# Patient Record
Sex: Female | Born: 1965 | Race: Black or African American | Hispanic: No | Marital: Married | Smoking: Never smoker
Health system: Southern US, Community
[De-identification: ages and names within clinical notes are randomized; demographics above are authoritative.]

## PROBLEM LIST (undated history)

## (undated) DIAGNOSIS — I10 Essential (primary) hypertension: Secondary | ICD-10-CM

---

## 1998-08-25 ENCOUNTER — Other Ambulatory Visit: Admission: RE | Admit: 1998-08-25 | Discharge: 1998-08-25 | Payer: Self-pay | Admitting: Obstetrics and Gynecology

## 1998-09-06 ENCOUNTER — Ambulatory Visit (HOSPITAL_COMMUNITY): Admission: RE | Admit: 1998-09-06 | Discharge: 1998-09-06 | Payer: Self-pay | Admitting: *Deleted

## 1998-10-25 ENCOUNTER — Emergency Department (HOSPITAL_COMMUNITY): Admission: EM | Admit: 1998-10-25 | Discharge: 1998-10-25 | Payer: Self-pay

## 2001-04-25 ENCOUNTER — Encounter: Admission: RE | Admit: 2001-04-25 | Discharge: 2001-07-24 | Payer: Self-pay | Admitting: Family Medicine

## 2001-09-16 ENCOUNTER — Other Ambulatory Visit: Admission: RE | Admit: 2001-09-16 | Discharge: 2001-09-16 | Payer: Self-pay | Admitting: Obstetrics and Gynecology

## 2001-11-11 ENCOUNTER — Encounter: Payer: Self-pay | Admitting: Obstetrics and Gynecology

## 2001-11-11 ENCOUNTER — Ambulatory Visit (HOSPITAL_COMMUNITY): Admission: RE | Admit: 2001-11-11 | Discharge: 2001-11-11 | Payer: Self-pay | Admitting: Obstetrics and Gynecology

## 2002-01-13 ENCOUNTER — Ambulatory Visit (HOSPITAL_COMMUNITY): Admission: RE | Admit: 2002-01-13 | Discharge: 2002-01-13 | Payer: Self-pay | Admitting: Obstetrics and Gynecology

## 2002-01-13 ENCOUNTER — Encounter: Payer: Self-pay | Admitting: Obstetrics and Gynecology

## 2002-01-31 ENCOUNTER — Ambulatory Visit (HOSPITAL_COMMUNITY): Admission: RE | Admit: 2002-01-31 | Discharge: 2002-01-31 | Payer: Self-pay | Admitting: Family Medicine

## 2002-02-28 ENCOUNTER — Encounter (HOSPITAL_COMMUNITY): Admission: AD | Admit: 2002-02-28 | Discharge: 2002-03-10 | Payer: Self-pay | Admitting: Obstetrics and Gynecology

## 2002-03-10 ENCOUNTER — Encounter: Payer: Self-pay | Admitting: Obstetrics and Gynecology

## 2002-03-31 ENCOUNTER — Encounter: Payer: Self-pay | Admitting: Obstetrics and Gynecology

## 2002-03-31 ENCOUNTER — Ambulatory Visit (HOSPITAL_COMMUNITY): Admission: RE | Admit: 2002-03-31 | Discharge: 2002-03-31 | Payer: Self-pay | Admitting: Obstetrics and Gynecology

## 2002-04-07 ENCOUNTER — Inpatient Hospital Stay (HOSPITAL_COMMUNITY): Admission: AD | Admit: 2002-04-07 | Discharge: 2002-04-07 | Payer: Self-pay | Admitting: Obstetrics and Gynecology

## 2002-04-08 ENCOUNTER — Inpatient Hospital Stay (HOSPITAL_COMMUNITY): Admission: AD | Admit: 2002-04-08 | Discharge: 2002-04-11 | Payer: Self-pay | Admitting: Obstetrics and Gynecology

## 2002-08-06 ENCOUNTER — Other Ambulatory Visit: Admission: RE | Admit: 2002-08-06 | Discharge: 2002-08-06 | Payer: Self-pay | Admitting: Obstetrics and Gynecology

## 2003-09-21 ENCOUNTER — Other Ambulatory Visit: Admission: RE | Admit: 2003-09-21 | Discharge: 2003-09-21 | Payer: Self-pay | Admitting: Obstetrics and Gynecology

## 2004-08-02 ENCOUNTER — Ambulatory Visit (HOSPITAL_COMMUNITY): Admission: RE | Admit: 2004-08-02 | Discharge: 2004-08-02 | Payer: Self-pay | Admitting: Obstetrics and Gynecology

## 2004-11-08 ENCOUNTER — Inpatient Hospital Stay (HOSPITAL_COMMUNITY): Admission: AD | Admit: 2004-11-08 | Discharge: 2004-11-08 | Payer: Self-pay | Admitting: Obstetrics and Gynecology

## 2004-11-11 ENCOUNTER — Ambulatory Visit (HOSPITAL_COMMUNITY): Admission: RE | Admit: 2004-11-11 | Discharge: 2004-11-11 | Payer: Self-pay | Admitting: Obstetrics and Gynecology

## 2004-11-14 ENCOUNTER — Inpatient Hospital Stay (HOSPITAL_COMMUNITY): Admission: AD | Admit: 2004-11-14 | Discharge: 2004-11-14 | Payer: Self-pay | Admitting: Obstetrics and Gynecology

## 2004-12-06 ENCOUNTER — Ambulatory Visit (HOSPITAL_COMMUNITY): Admission: RE | Admit: 2004-12-06 | Discharge: 2004-12-06 | Payer: Self-pay | Admitting: Obstetrics and Gynecology

## 2005-01-02 ENCOUNTER — Inpatient Hospital Stay (HOSPITAL_COMMUNITY): Admission: AD | Admit: 2005-01-02 | Discharge: 2005-01-05 | Payer: Self-pay | Admitting: Obstetrics and Gynecology

## 2005-01-10 ENCOUNTER — Inpatient Hospital Stay (HOSPITAL_COMMUNITY): Admission: AD | Admit: 2005-01-10 | Discharge: 2005-01-10 | Payer: Self-pay | Admitting: Obstetrics and Gynecology

## 2006-02-26 ENCOUNTER — Emergency Department (HOSPITAL_COMMUNITY): Admission: EM | Admit: 2006-02-26 | Discharge: 2006-02-27 | Payer: Self-pay | Admitting: Emergency Medicine

## 2006-03-09 ENCOUNTER — Ambulatory Visit (HOSPITAL_COMMUNITY): Admission: RE | Admit: 2006-03-09 | Discharge: 2006-03-10 | Payer: Self-pay | Admitting: Orthopaedic Surgery

## 2006-09-20 IMAGING — US US FETAL BPP W/O NONSTRESS
1 series · 14 of 23 positions shown · non-contrast
Comparison: 08/02/04.

CLINICAL DATA: Third trimester pregnancy.  Hypertension.  Proteinuria.   Nonreative NST.  

 BIOPHYSICAL PROFILE

[Series 1: us fetal bpp w/o nonstress · 0.37mm/px · 14 of 23 slices shown]
[im 1/23]
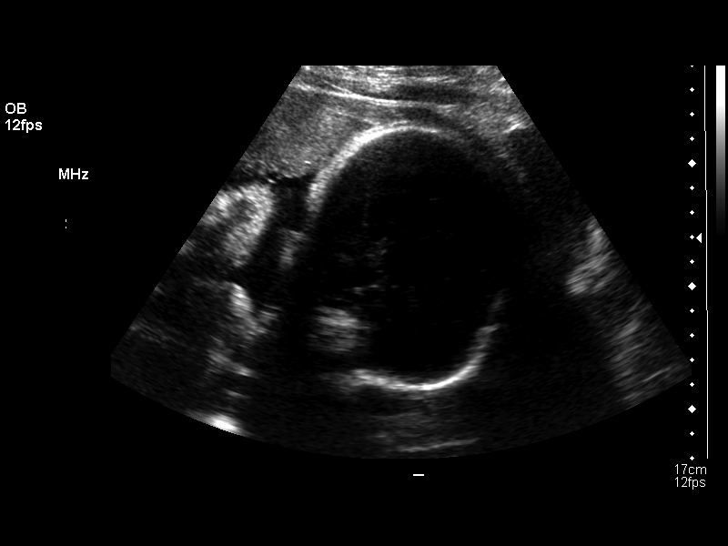
[im 3/23]
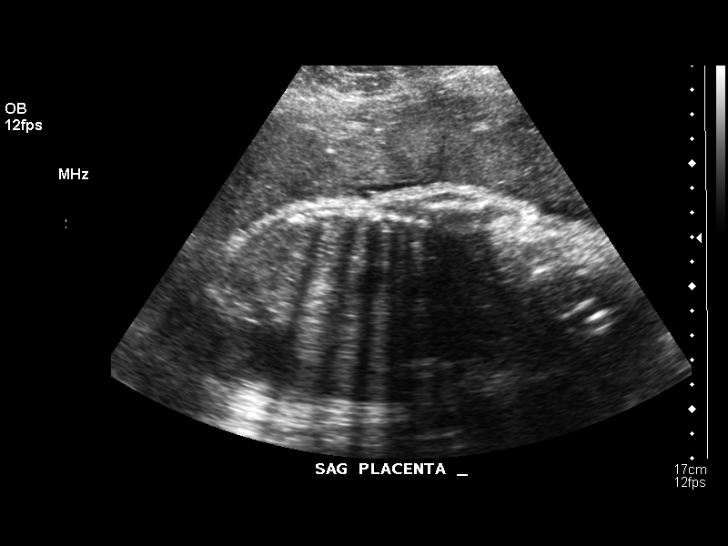
[im 5/23]
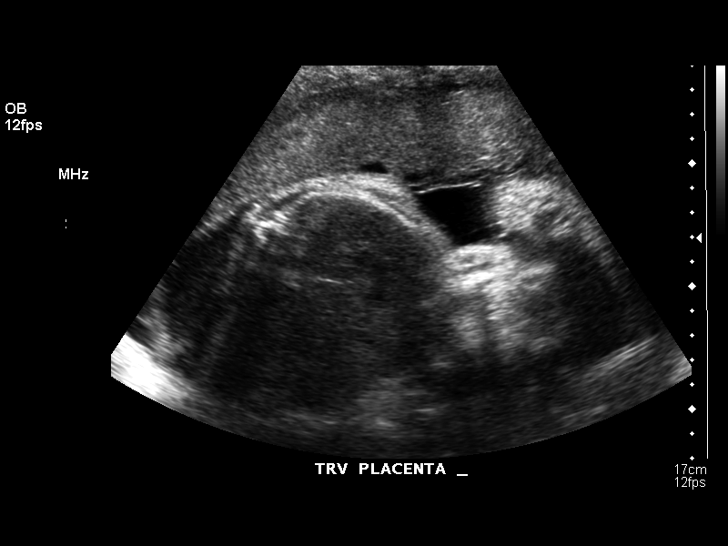
[im 6/23]
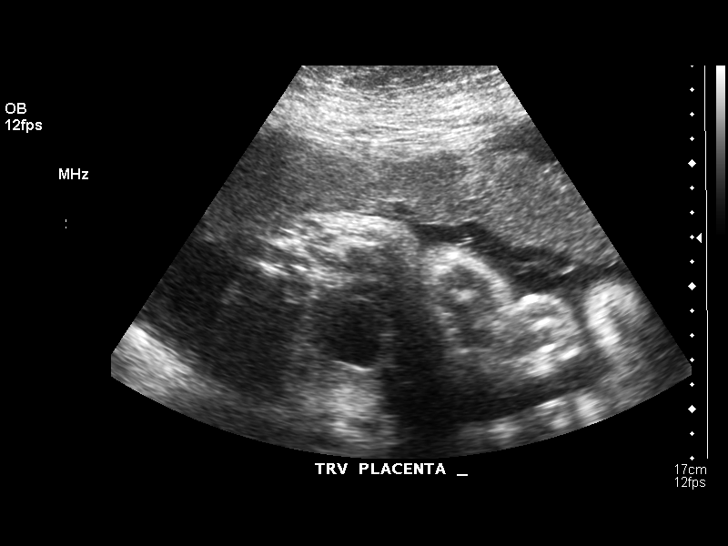
[im 8/23]
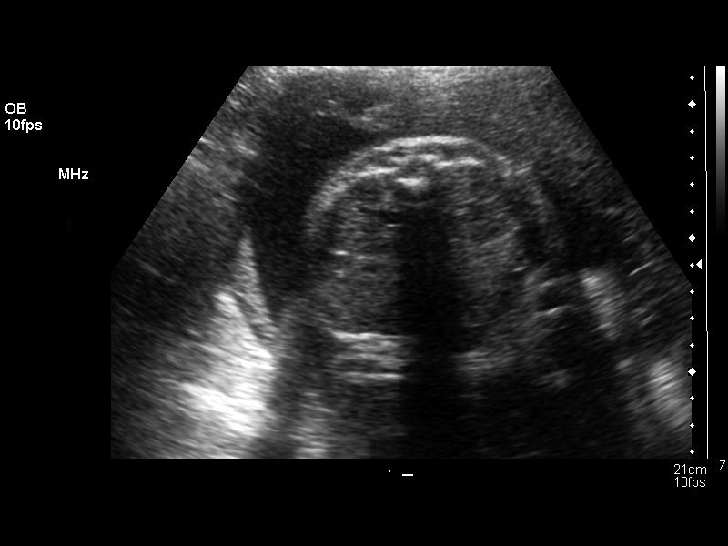
[im 10/23]
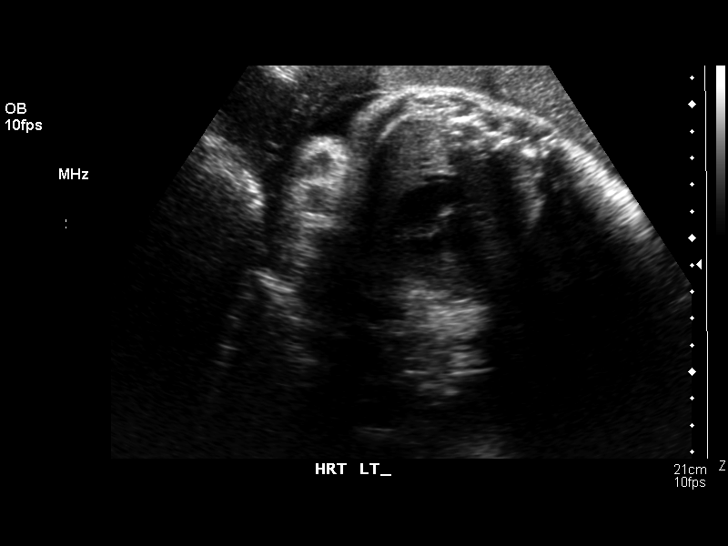
[im 11/23]
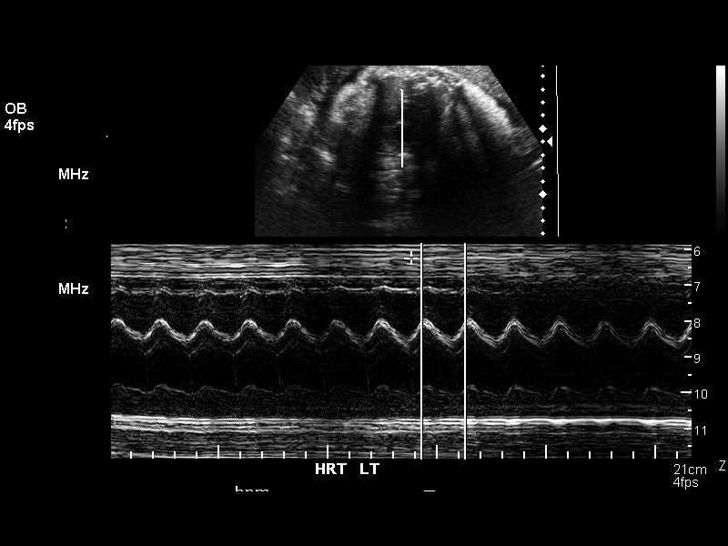
[im 13/23]
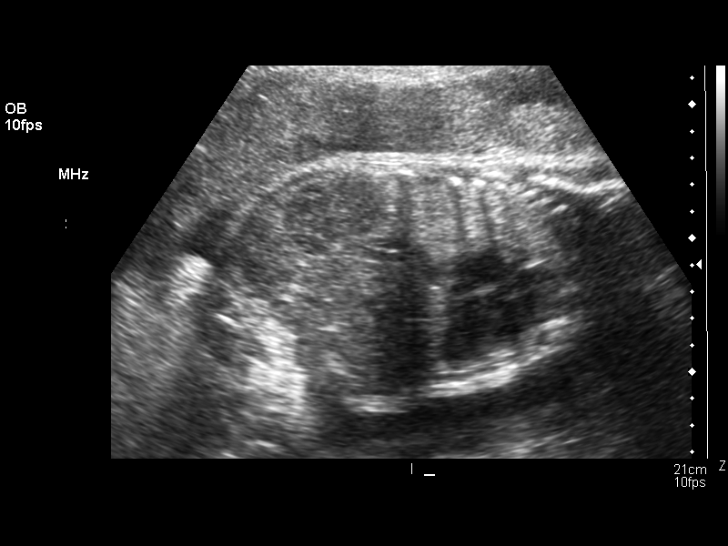
[im 14/23]
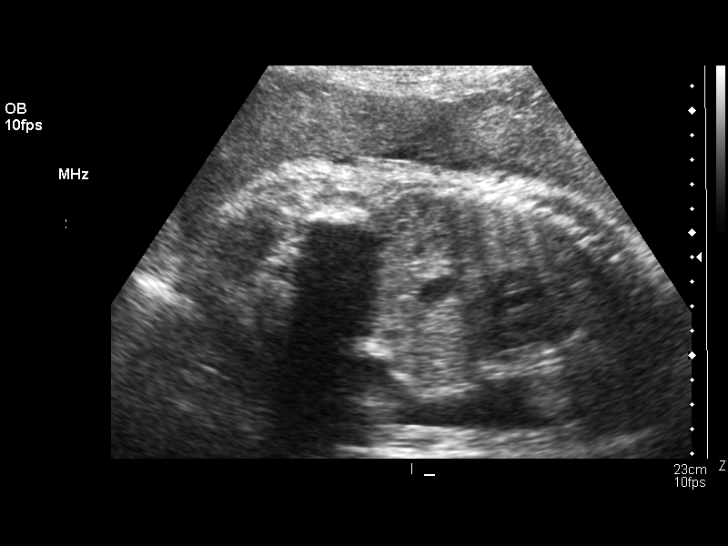
[im 16/23]
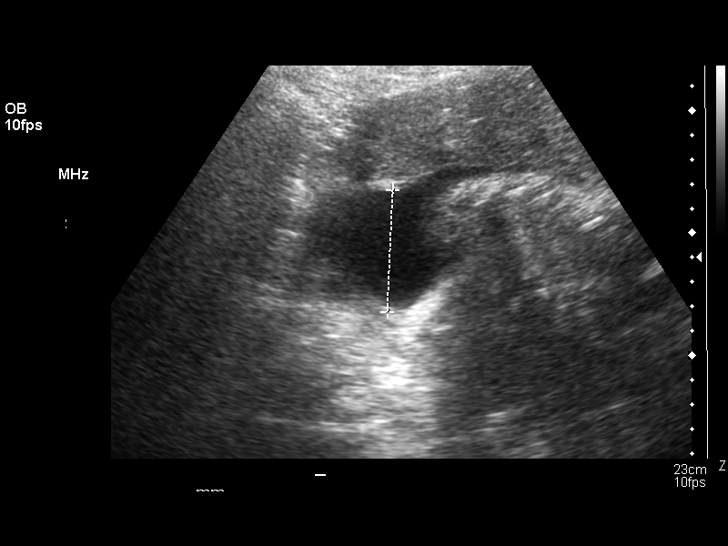
[im 18/23]
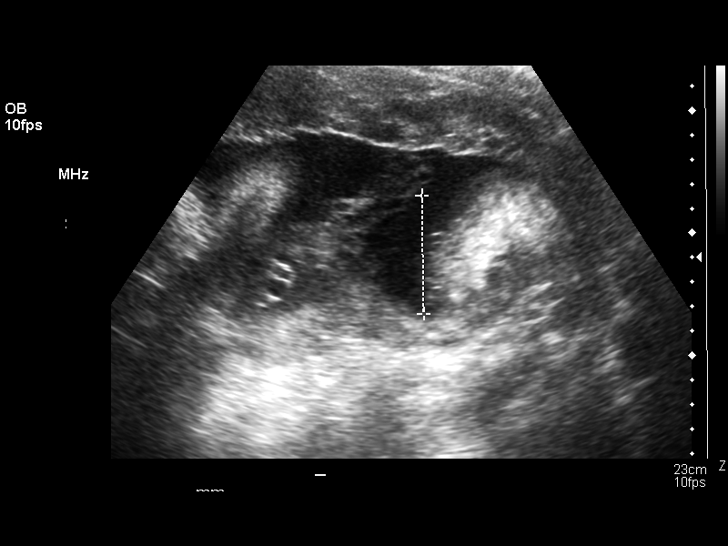
[im 19/23]
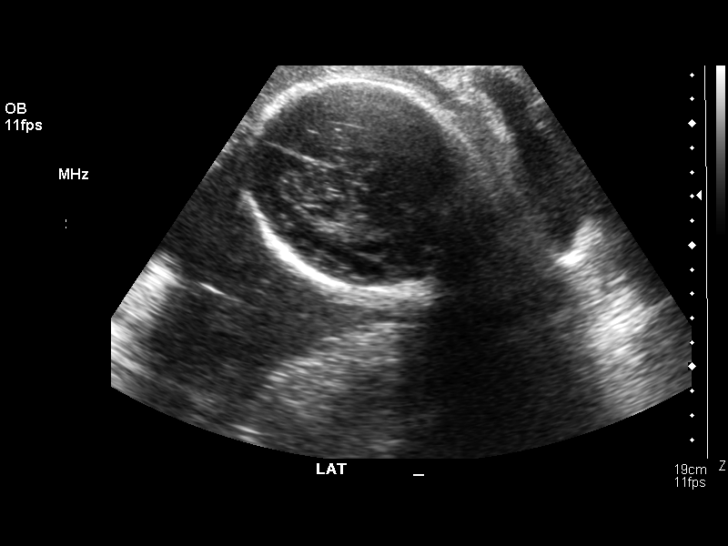
[im 21/23]
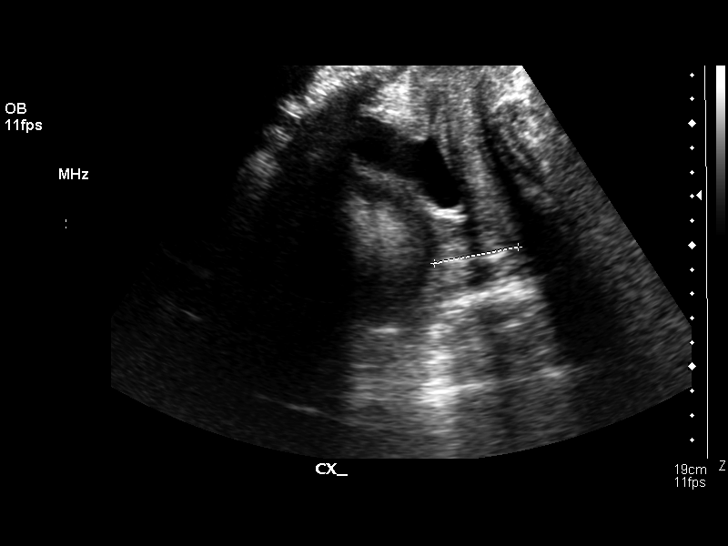
[im 23/23]
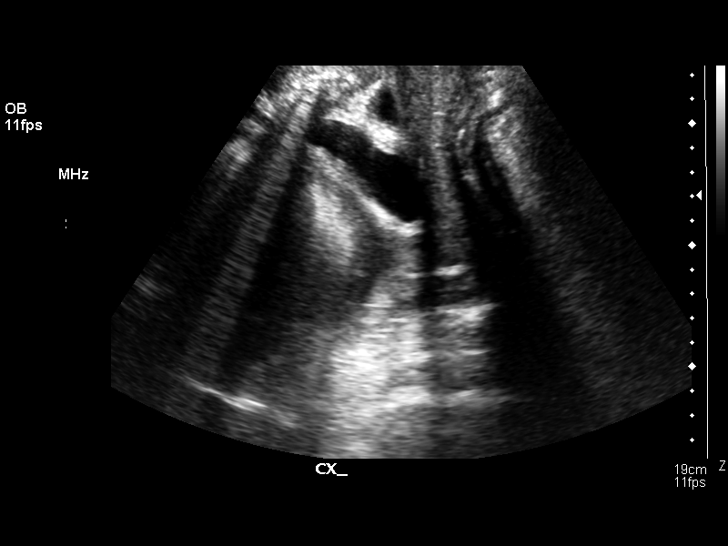

[14 of 23 positions shown; findings below may reference images not displayed]

Number of Fetuses:  1
 Heart rate:  150
 Movement:  Yes
 Breathing:  Yes
 Presentation:  Cephalic
 Placental Location:  Anterior
 Grade:  II
 Previa:  No
 Amniotic Fluid (Subjective):  Normal
 Amniotic Fluid (Objective):  18.8 cm AFI (5th -95th%ile = 8.6 ? 24.2 cm for 32 wks)

 Fetal measurements and complete anatomic evaluation were not requested.  The following fetal anatomy was visualized on this exam:  Lateral ventricles, stomach, kidneys and bladder.

 BPP SCORING
 Movements: 2  Time:  20 minutes
 Breathing:  2
 Tone:  2
 Amniotic Fluid:  2
 Total Score:  8

 MATERNAL UTERINE AND ADNEXAL FINDINGS
 Cervix:  3.5 cm Translabially
IMPRESSION: Biophysical profile score of [DATE].

## 2006-09-23 IMAGING — US US FETAL BPP W/O NONSTRESS
1 series · 18 of 27 positions shown · non-contrast
Comparison: none

CLINICAL DATA: 33 week 3 day assigned gestational age.  Chronic hypertension.  Nonreactive NST.  Evaluate biophysical profile.

[Series 1: us fetal bpp w/o nonstress · non-contrast · 18 of 27 slices shown]
[im 1/27]
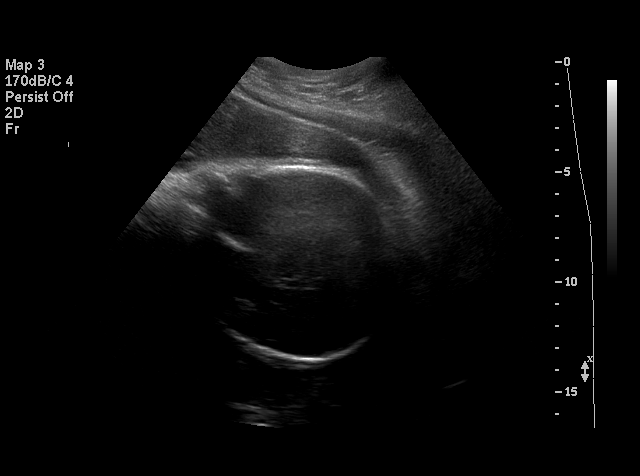
[im 3/27]
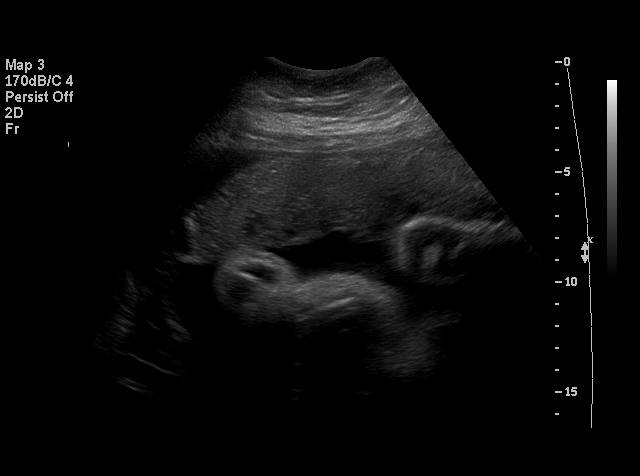
[im 4/27]
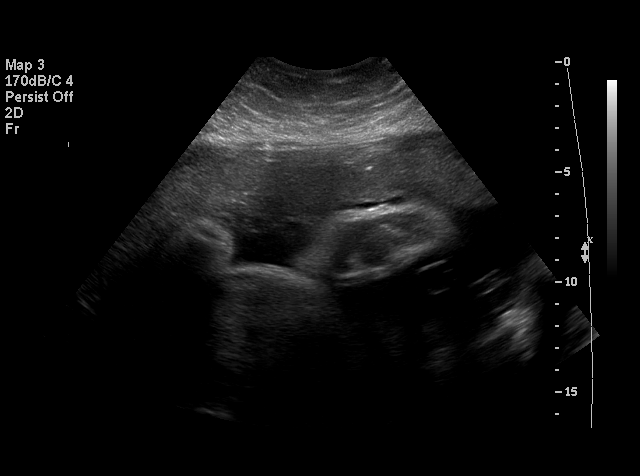
[im 6/27]
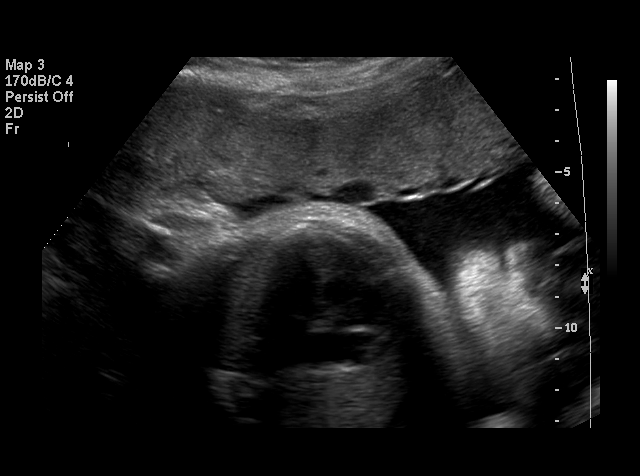
[im 7/27]
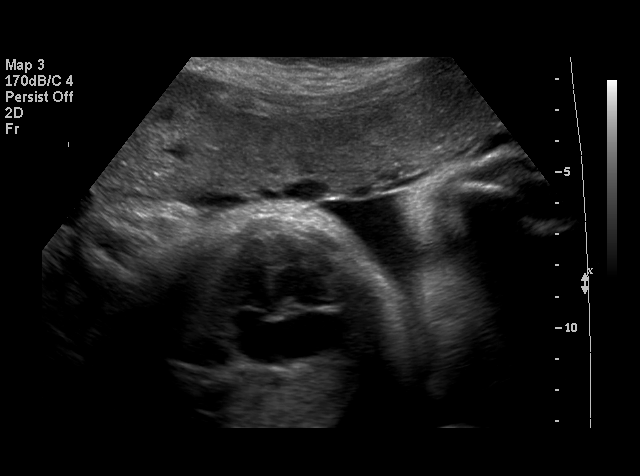
[im 9/27]
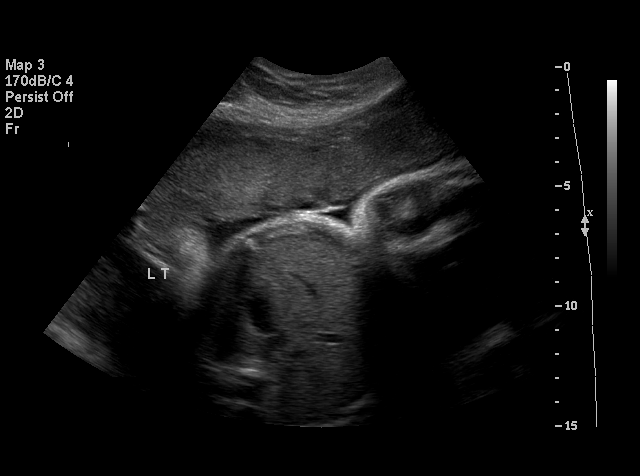
[im 10/27]
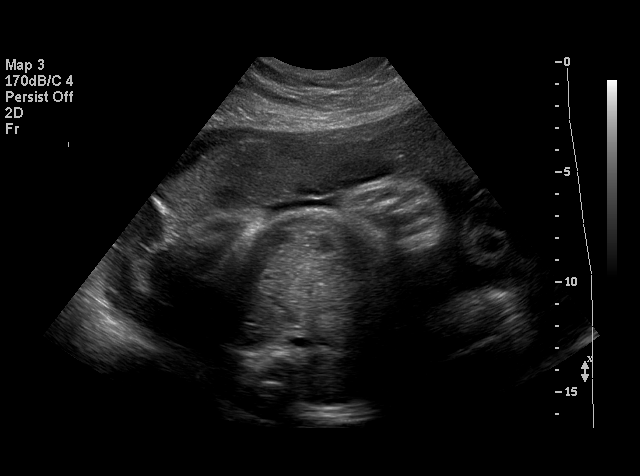
[im 12/27]
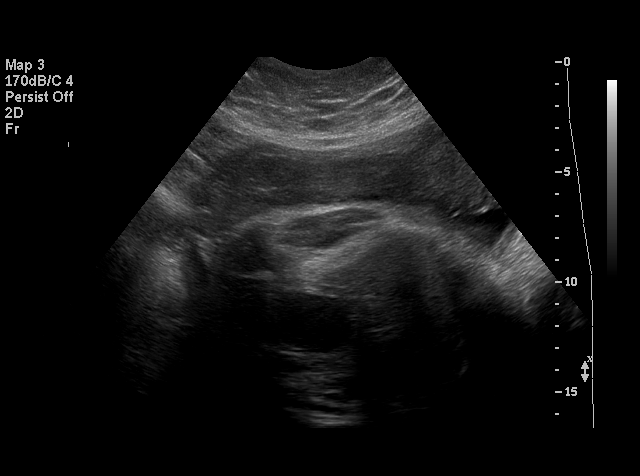
[im 13/27]
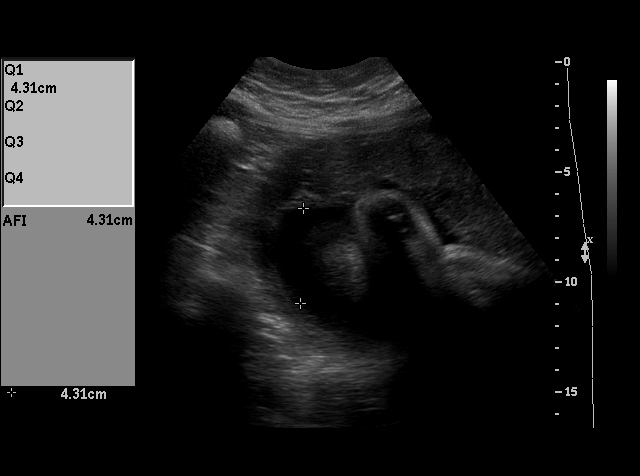
[im 15/27]
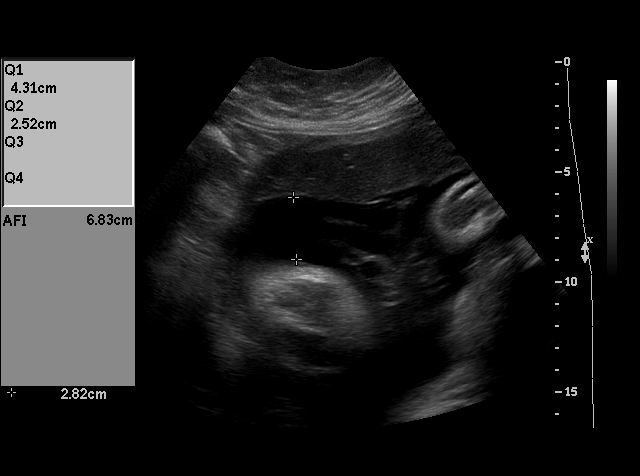
[im 16/27]
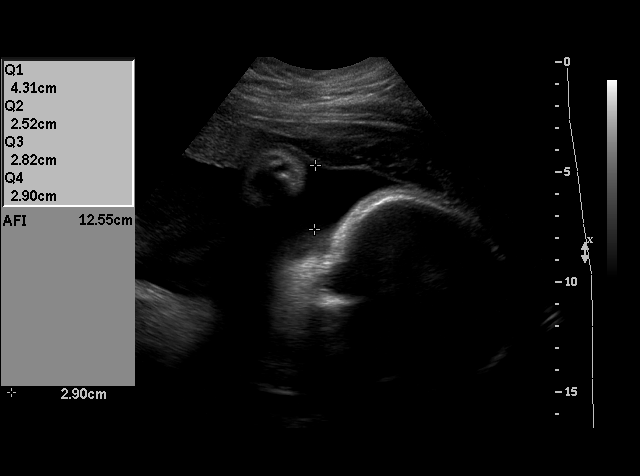
[im 18/27]
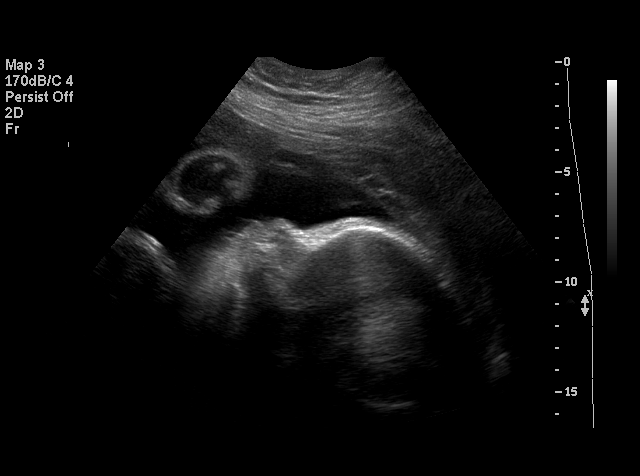
[im 19/27]
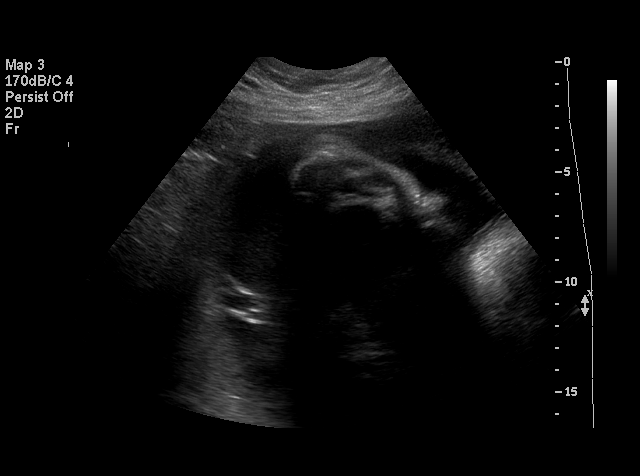
[im 21/27]
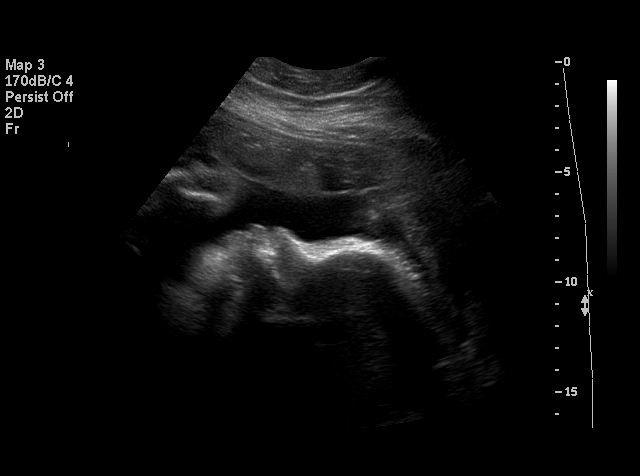
[im 22/27]
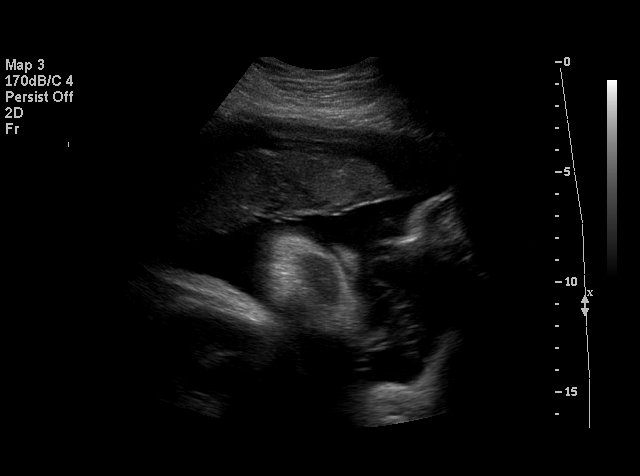
[im 24/27]
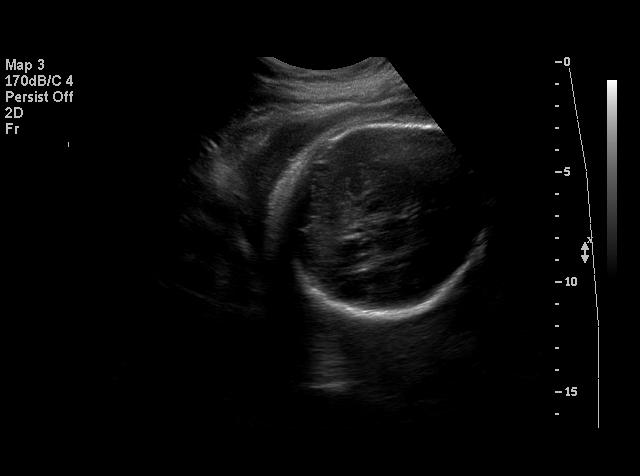
[im 25/27]
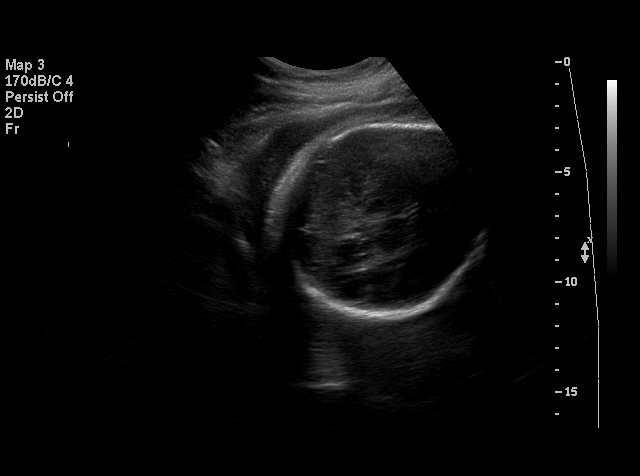
[im 27/27]
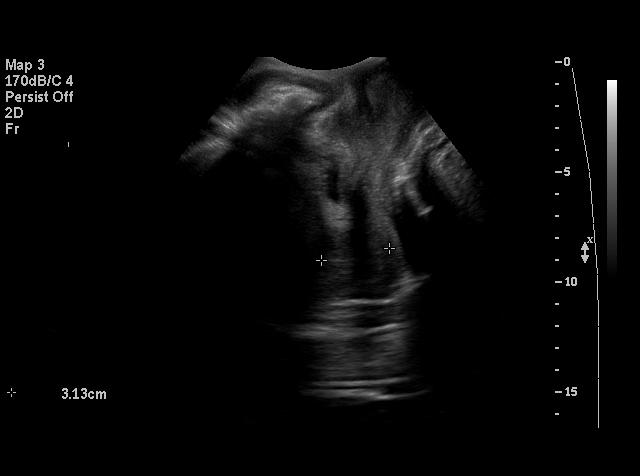

[18 of 27 positions shown; findings below may reference images not displayed]

BIOPHYSICAL PROFILE

 Number of Fetuses: 1
 Heart rate:  146
 Movement:  Yes
 Breathing:  Yes
 Presentation:  Cephalic 
 Placental Location:  Anterior
 Grade:  I
 Previa:  No
 Amniotic Fluid (Subjective):  Normal
 Amniotic Fluid (Objective):  12.6 cm AFI (5th -95th%ile = 8.6 ? 24.2 cm for 32 wks)

 Fetal measurements and complete anatomic evaluation were not requested.  The following fetal anatomy was visualized on this exam:  Lateral ventricles, four chamber heart, stomach, kidneys, bladder, and female genitalia.

 BPP SCORING
 Movements:  2  Time:  20 minutes
 Breathing:  2
 Tone:  2
 Amniotic Fluid:  2
 Total Score:  8

 MATERNAL UTERINE AND ADNEXAL FINDINGS
 Cervix:  3.1 cm Transabdominally
IMPRESSION: 1.  Single living intrauterine fetus in cephalic presentation.  Biophysical profile score [DATE].

## 2008-01-08 IMAGING — CR DG ANKLE COMPLETE 3+V*L*
2 series · 2 of 2 positions shown · non-contrast
Comparison: none

CLINICAL DATA: Left trimalleolar fracture dislocation.      
LEFT ANKLE - 2 VIEW:

[view not recorded (1 of 2)]
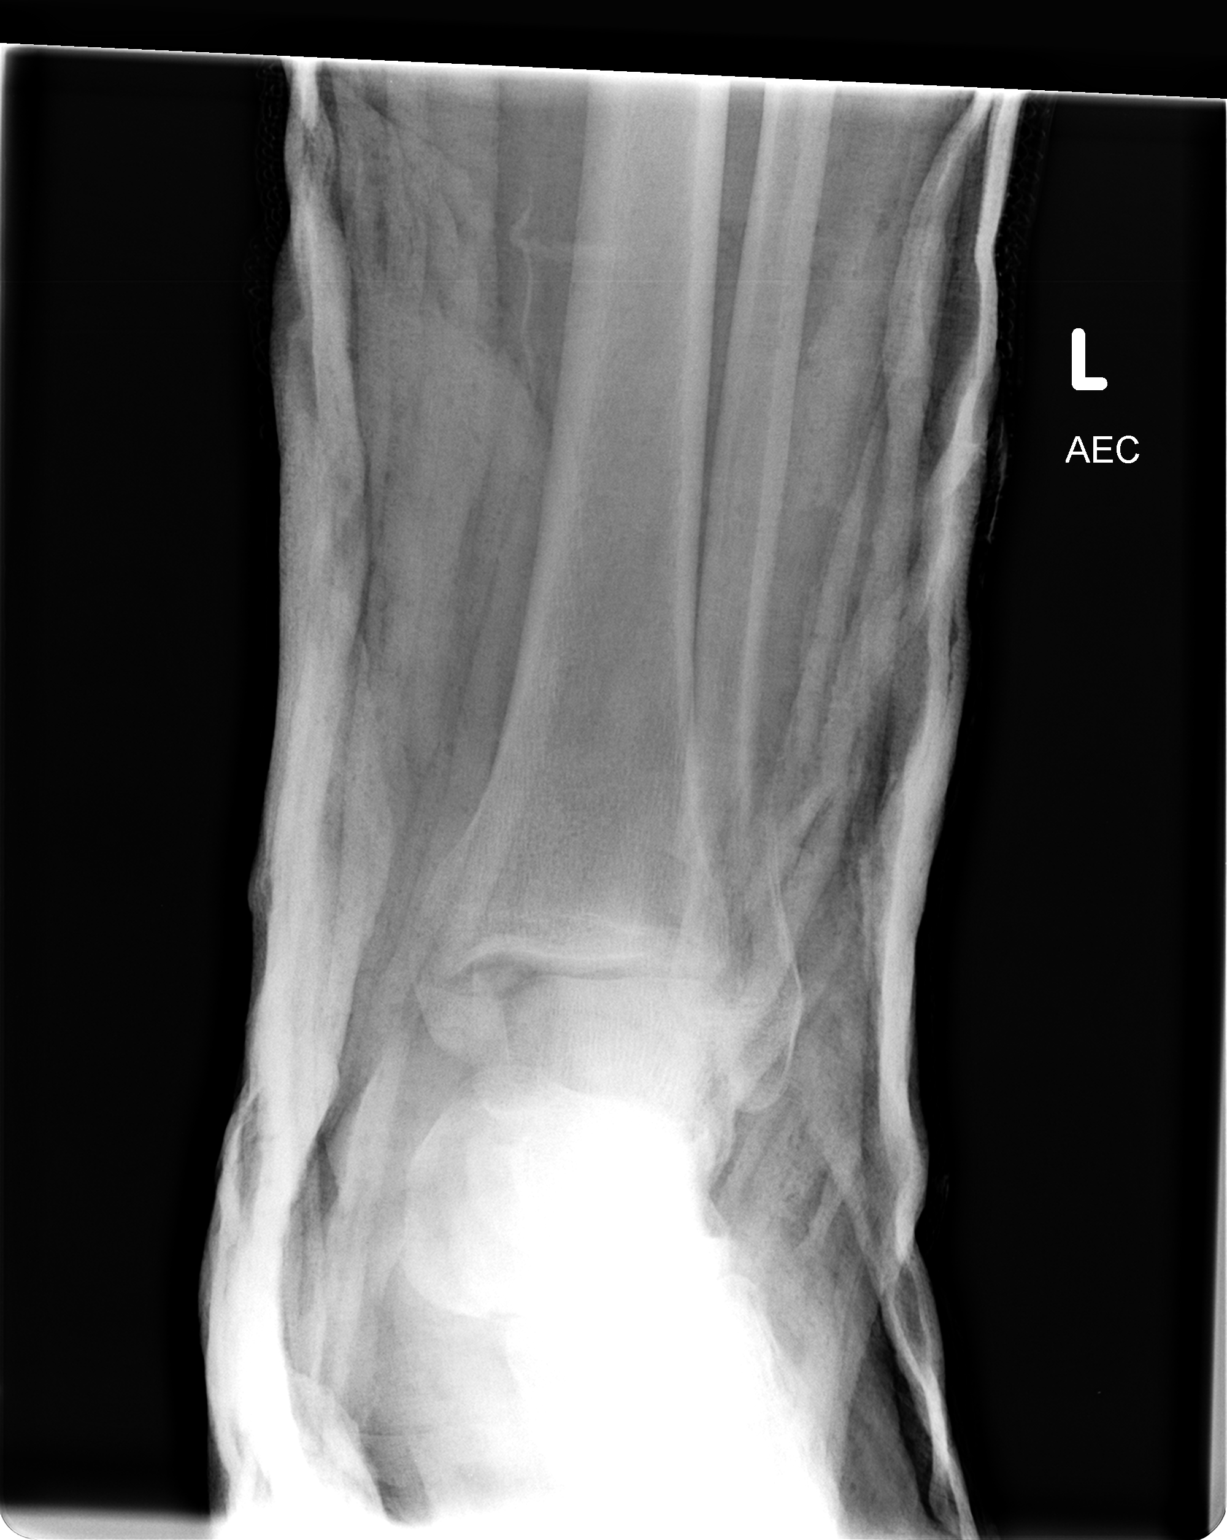

[view not recorded (2 of 2)]
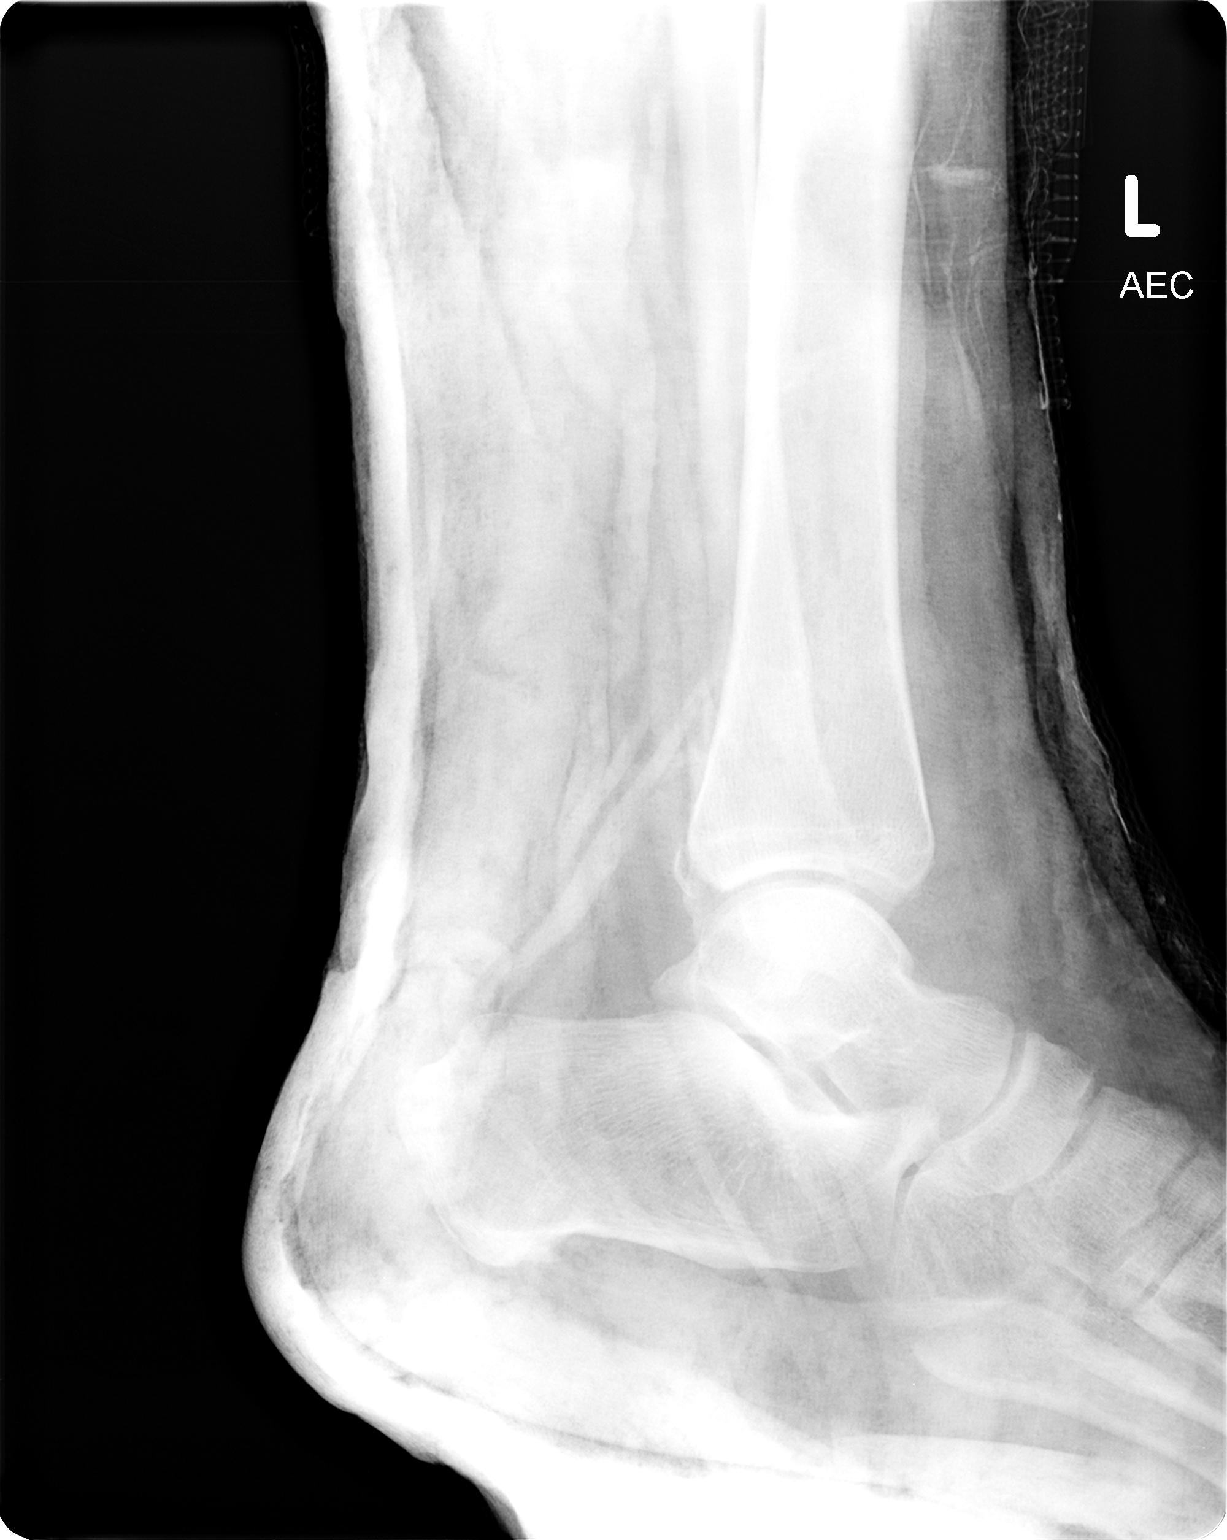

[2 of 2 positions shown; findings below may reference images not displayed]

FINDINGS: There has been interval placement of a plaster cast which obscures detail.  There has been interval relocation of the tibiotalar joint with near anatomic alignment and position of trimalleolar fractures.  No other significant abnormalities are identified.
IMPRESSION: Interval reduction/relocation of trimalleolar fracture-dislocation.

## 2008-01-08 IMAGING — CR DG ANKLE 2V *L*
2 series · 2 of 2 positions shown · non-contrast
Comparison: none

HISTORY: Left ankle pain and deformity, fall

[view not recorded (1 of 2)]
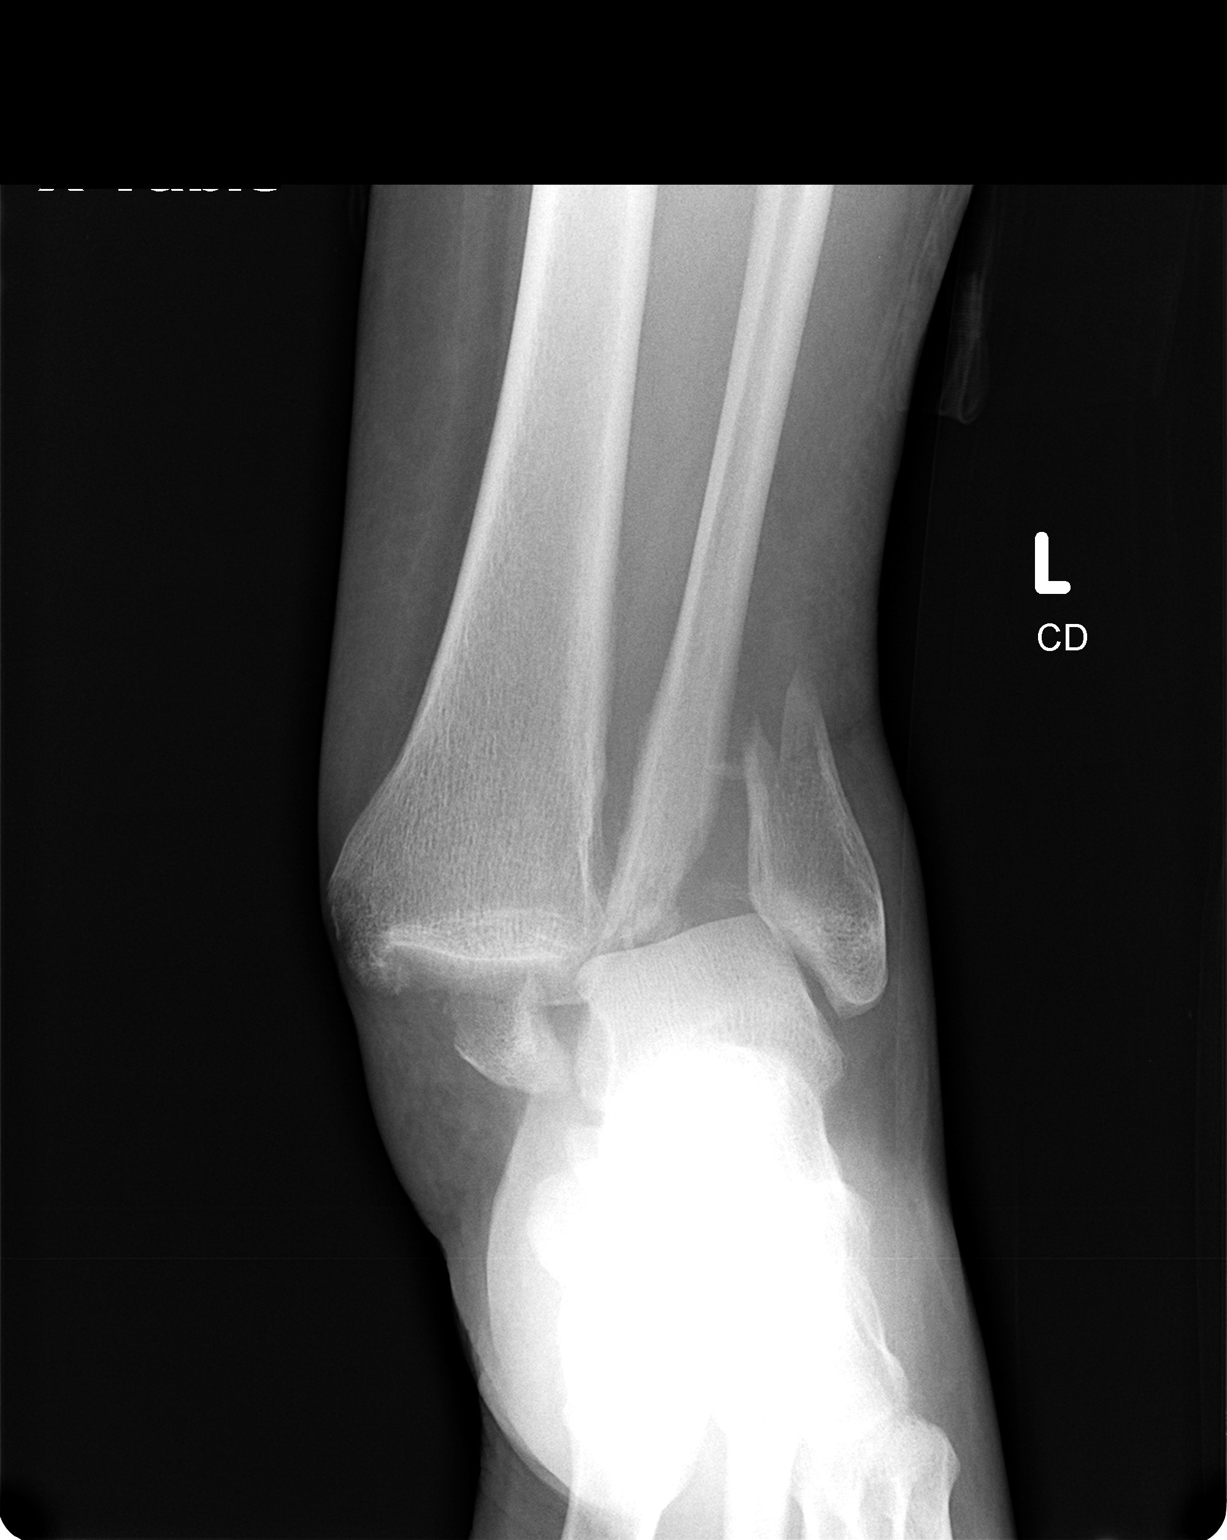

[view not recorded (2 of 2)]
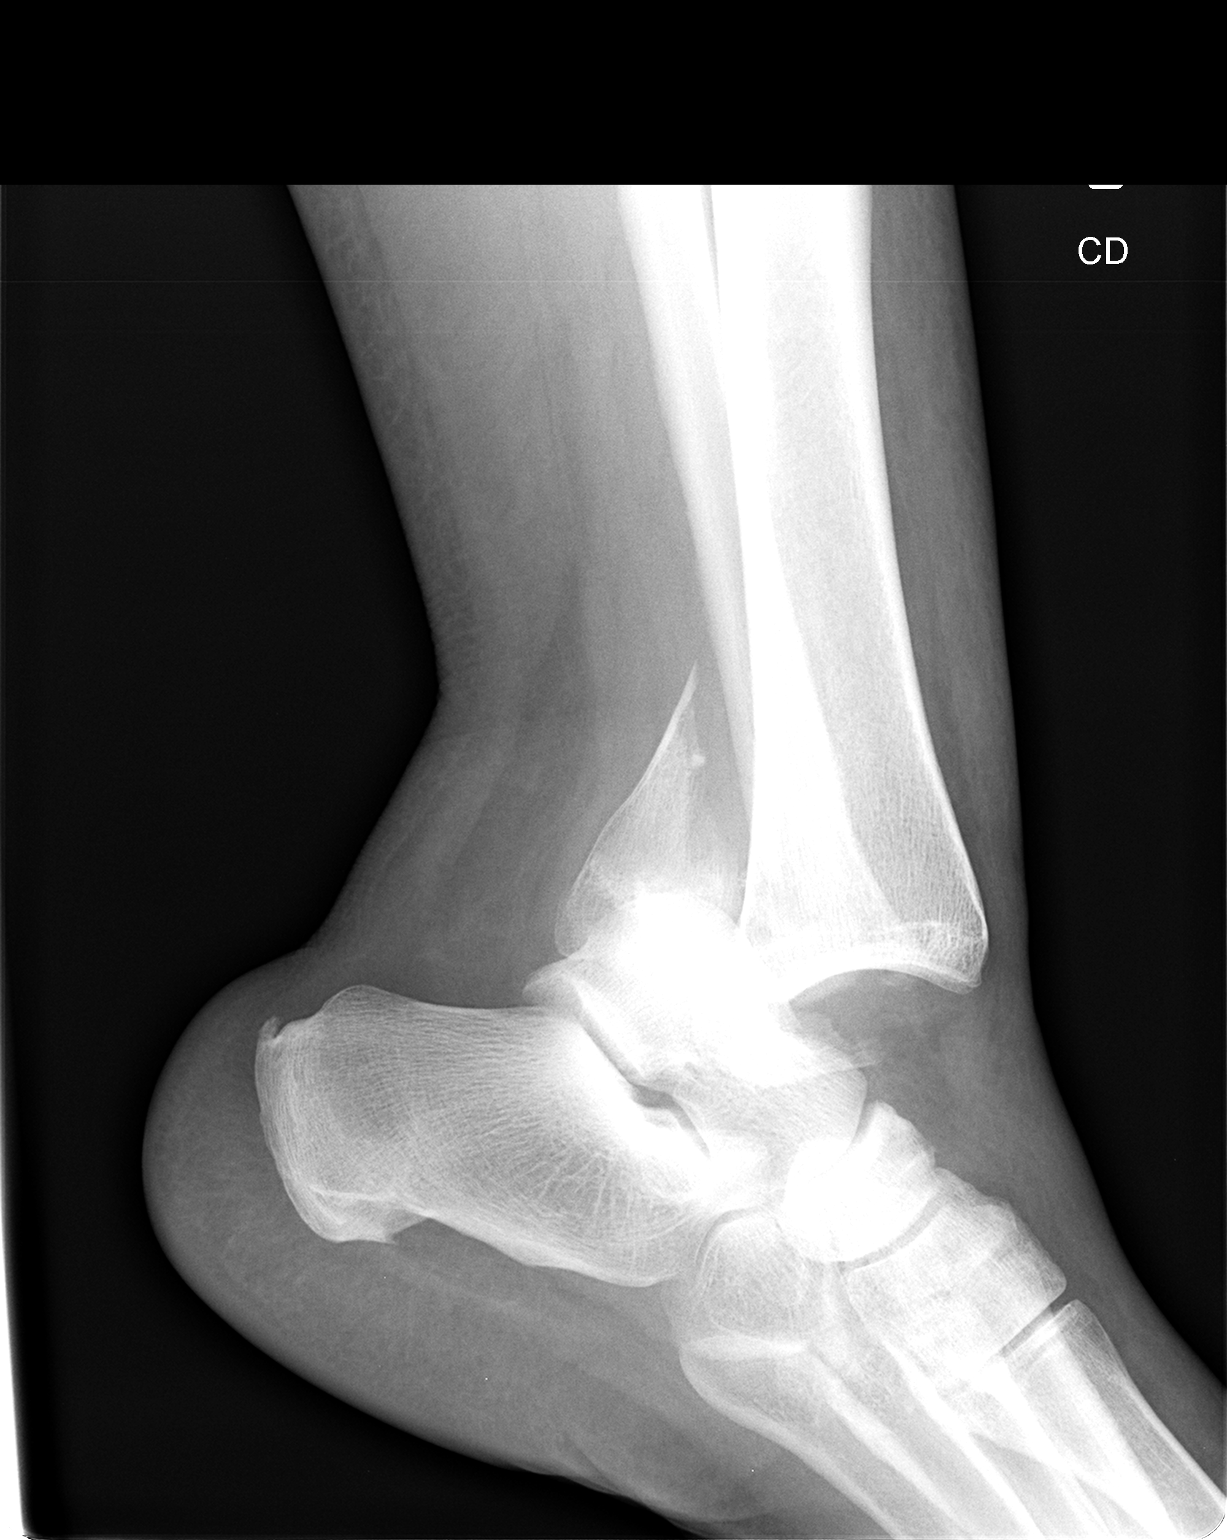

[2 of 2 positions shown; findings below may reference images not displayed]

LEFT ANKLE 2 VIEWS:

Trimalleolar fracture-dislocation left ankle.
Talus dislocated posteriorly and laterally from tibia.
Transverse fracture medial malleolus, significantly displaced laterally.
Transverse fracture distal left fibular metaphysis, laterally displaced.
Posterior displacement of large posterior malleolar fragment.
All 3 of trimalleolar fracture fragments retain some degree of relationship to
the talus.
Minimal calcaneal spurring.
Associated soft tissue swelling and deformity.
IMPRESSION: Trimalleolar fracture-dislocation left ankle.

## 2017-05-02 ENCOUNTER — Emergency Department (HOSPITAL_BASED_OUTPATIENT_CLINIC_OR_DEPARTMENT_OTHER)
Admission: EM | Admit: 2017-05-02 | Discharge: 2017-05-02 | Disposition: A | Discharge status: Home / Self Care | Payer: BLUE CROSS/BLUE SHIELD | Attending: Emergency Medicine | Admitting: Emergency Medicine

## 2017-05-02 ENCOUNTER — Other Ambulatory Visit: Payer: Self-pay

## 2017-05-02 ENCOUNTER — Encounter (HOSPITAL_BASED_OUTPATIENT_CLINIC_OR_DEPARTMENT_OTHER): Payer: Self-pay

## 2017-05-02 DIAGNOSIS — I10 Essential (primary) hypertension: Secondary | ICD-10-CM | POA: Insufficient documentation

## 2017-05-02 DIAGNOSIS — N390 Urinary tract infection, site not specified: Secondary | ICD-10-CM | POA: Insufficient documentation

## 2017-05-02 DIAGNOSIS — R109 Unspecified abdominal pain: Secondary | ICD-10-CM

## 2017-05-02 DIAGNOSIS — R1013 Epigastric pain: Secondary | ICD-10-CM | POA: Diagnosis present

## 2017-05-02 DIAGNOSIS — Z79899 Other long term (current) drug therapy: Secondary | ICD-10-CM | POA: Diagnosis not present

## 2017-05-02 HISTORY — DX: Essential (primary) hypertension: I10

## 2017-05-02 LAB — CBC WITH DIFFERENTIAL/PLATELET
BASOS PCT: 0 %
Basophils Absolute: 0 10*3/uL (ref 0.0–0.1)
EOS ABS: 0.1 10*3/uL (ref 0.0–0.7)
Eosinophils Relative: 1 %
HCT: 38.8 % (ref 36.0–46.0)
Hemoglobin: 12.9 g/dL (ref 12.0–15.0)
Lymphocytes Relative: 15 %
Lymphs Abs: 1.6 10*3/uL (ref 0.7–4.0)
MCH: 26 pg (ref 26.0–34.0)
MCHC: 33.2 g/dL (ref 30.0–36.0)
MCV: 78.1 fL (ref 78.0–100.0)
MONO ABS: 0.4 10*3/uL (ref 0.1–1.0)
MONOS PCT: 4 %
Neutro Abs: 8.7 10*3/uL — ABNORMAL HIGH (ref 1.7–7.7)
Neutrophils Relative %: 80 %
PLATELETS: 349 10*3/uL (ref 150–400)
RBC: 4.97 MIL/uL (ref 3.87–5.11)
RDW: 15 % (ref 11.5–15.5)
WBC: 10.9 10*3/uL — ABNORMAL HIGH (ref 4.0–10.5)

## 2017-05-02 LAB — URINALYSIS, ROUTINE W REFLEX MICROSCOPIC
BILIRUBIN URINE: NEGATIVE
GLUCOSE, UA: NEGATIVE mg/dL
KETONES UR: NEGATIVE mg/dL
Nitrite: NEGATIVE
PH: 6 (ref 5.0–8.0)
Protein, ur: NEGATIVE mg/dL
Specific Gravity, Urine: 1.025 (ref 1.005–1.030)

## 2017-05-02 LAB — COMPREHENSIVE METABOLIC PANEL
ALK PHOS: 74 U/L (ref 38–126)
ALT: 18 U/L (ref 14–54)
AST: 23 U/L (ref 15–41)
Albumin: 4.1 g/dL (ref 3.5–5.0)
Anion gap: 10 (ref 5–15)
BILIRUBIN TOTAL: 0.5 mg/dL (ref 0.3–1.2)
BUN: 17 mg/dL (ref 6–20)
CALCIUM: 9.3 mg/dL (ref 8.9–10.3)
CO2: 27 mmol/L (ref 22–32)
CREATININE: 1 mg/dL (ref 0.44–1.00)
Chloride: 96 mmol/L — ABNORMAL LOW (ref 101–111)
GFR calc non Af Amer: >60 mL/min (ref >60)
Glucose, Bld: 154 mg/dL — ABNORMAL HIGH (ref 65–99)
Potassium: 3.1 mmol/L — ABNORMAL LOW (ref 3.5–5.1)
SODIUM: 133 mmol/L — AB (ref 135–145)
TOTAL PROTEIN: 8.1 g/dL (ref 6.5–8.1)

## 2017-05-02 LAB — URINALYSIS, MICROSCOPIC (REFLEX)

## 2017-05-02 LAB — LIPASE, BLOOD: Lipase: 20 U/L (ref 11–51)

## 2017-05-02 MED ORDER — ONDANSETRON 4 MG PO TBDP
4.0000 mg | ORAL_TABLET | Freq: Once | ORAL | Status: AC | PRN
  Administered 2017-05-02: 4 mg via ORAL
  Filled 2017-05-02: qty 1

## 2017-05-02 MED ORDER — FENTANYL CITRATE (PF) 100 MCG/2ML IJ SOLN
100.0000 ug | Freq: Once | INTRAMUSCULAR | Status: AC
  Administered 2017-05-02: 100 ug via INTRAVENOUS
  Filled 2017-05-02: qty 2

## 2017-05-02 MED ORDER — DICYCLOMINE HCL 20 MG PO TABS: 20.0000 mg | ORAL_TABLET | Freq: Four times a day (QID) | ORAL | 0 refills | Status: AC | PRN

## 2017-05-02 MED ORDER — PANTOPRAZOLE SODIUM 40 MG IV SOLR
40.0000 mg | Freq: Once | INTRAVENOUS | Status: AC
  Administered 2017-05-02: 40 mg via INTRAVENOUS
  Filled 2017-05-02: qty 40

## 2017-05-02 MED ORDER — ONDANSETRON 8 MG PO TBDP: 8.0000 mg | ORAL_TABLET | Freq: Three times a day (TID) | ORAL | 0 refills | Status: AC | PRN

## 2017-05-02 MED ORDER — CEPHALEXIN 500 MG PO CAPS: 500.0000 mg | ORAL_CAPSULE | Freq: Two times a day (BID) | ORAL | 0 refills | Status: AC

## 2017-05-02 MED ORDER — SUCRALFATE 1 GM/10ML PO SUSP
1.0000 g | Freq: Once | ORAL | Status: AC
  Administered 2017-05-02: 1 g via ORAL
  Filled 2017-05-02: qty 10

## 2017-05-02 MED ORDER — SODIUM CHLORIDE 0.9 % IV SOLN
1.0000 g | Freq: Once | INTRAVENOUS | Status: AC
  Administered 2017-05-02: 1 g via INTRAVENOUS
  Filled 2017-05-02: qty 10

## 2017-05-02 NOTE — ED Provider Notes (Signed)
[facility] Provider Note: Kelsey Dell, MD, FACEP  CSN: [id number] MRN: [id number] ARRIVAL: 05/02/17 at 0221 ROOM: [facility]   CHIEF COMPLAINT  Abdominal Pain   HISTORY OF PRESENT ILLNESS  05/02/17 3:27 AM Kelsey Warren is a 52 y.o. female who developed abdominal pain yesterday evening after eating dinner.  This was about 9 PM.  She localizes the pain to her epigastrium and it sometimes radiates to her back. (She told her nurse that the pain was in her lower abdomen radiating to her lower back.)  She describes it as feeling crampy like labor pains.  She rates it as a 10 out of 10.  It is somewhat worse with movement or palpation.  She has had nausea but no vomiting or diarrhea.  She took an antacid without relief.  She has been on ibuprofen for about a year for back pain.   Past Medical History:  Diagnosis Date  . Hypertension     History reviewed. No pertinent surgical history.  No family history on file.  Social History   Tobacco Use  . Smoking status: Never Smoker  . Smokeless tobacco: Never Used  Substance Use Topics  . Alcohol use: No    Frequency: Never  . Drug use: No    Prior to Admission medications   Medication Sig Start Date End Date Taking? Authorizing Provider  ibuprofen (ADVIL,MOTRIN) 800 MG tablet Take 800 mg by mouth 3 (three) times daily.   Yes [provider]  lisinopril-hydrochlorothiazide (PRINZIDE,ZESTORETIC) 20-25 MG tablet Take 1 tablet by mouth daily.   Yes [provider]    Allergies Patient has no known allergies.   REVIEW OF SYSTEMS  Negative except as noted here or in the History of Present Illness.   PHYSICAL EXAMINATION  Initial Vital Signs Blood pressure (!) 166/94, pulse 77, temperature 98.7 F (37.1 C), temperature source Oral, resp. rate 18, height 5\' 7"  (1.702 m), weight 99.8 kg (220 lb), last menstrual period 04/16/2017, SpO2 99 %.  Examination General: Well-developed, well-nourished  female in no acute distress; appearance consistent with age of record HENT: normocephalic; atraumatic Eyes: pupils equal, round and reactive to light; extraocular muscles intact Neck: supple Heart: regular rate and rhythm Lungs: clear to auscultation bilaterally Abdomen: soft; nondistended; epigastric and periumbilical tenderness; no masses or hepatosplenomegaly; bowel sounds present Extremities: No deformity; full range of motion; pulses normal Neurologic: Awake, alert and oriented; motor function intact in all extremities and symmetric; no facial droop Skin: Warm and dry Psychiatric: Normal mood and affect   RESULTS  Summary of this visit's results, reviewed by myself:   EKG Interpretation  Date/Time:    Ventricular Rate:    PR Interval:    QRS Duration:   QT Interval:    QTC Calculation:   R Axis:     Text Interpretation:        Laboratory Studies: Results for orders placed or performed during the hospital encounter of 05/02/17 (from the past 24 hour(s))  Lipase, blood     Status: None   Collection Time: 05/02/17  2:44 AM  Result Value Ref Range   Lipase 20 11 - 51 U/L  Comprehensive metabolic panel     Status: Abnormal   Collection Time: 05/02/17  2:44 AM  Result Value Ref Range   Sodium 133 (L) 135 - 145 mmol/L   Potassium 3.1 (L) 3.5 - 5.1 mmol/L   Chloride 96 (L) 101 - 111 mmol/L   CO2 27 22 - 32  mmol/L   Glucose, Bld 154 (H) 65 - 99 mg/dL   BUN 17 6 - 20 mg/dL   Creatinine, Ser 2.951.00 0.44 - 1.00 mg/dL   Calcium 9.3 8.9 - 62.110.3 mg/dL   Total Protein 8.1 6.5 - 8.1 g/dL   Albumin 4.1 3.5 - 5.0 g/dL   AST 23 15 - 41 U/L   ALT 18 14 - 54 U/L   Alkaline Phosphatase 74 38 - 126 U/L   Total Bilirubin 0.5 0.3 - 1.2 mg/dL   GFR calc non Af Amer >60 >60 mL/min   GFR calc Af Amer >60 >60 mL/min   Anion gap 10 5 - 15  Urinalysis, Routine w reflex microscopic     Status: Abnormal   Collection Time: 05/02/17  2:44 AM  Result Value Ref Range   Color, Urine YELLOW  YELLOW   APPearance HAZY (A) CLEAR   Specific Gravity, Urine 1.025 1.005 - 1.030   pH 6.0 5.0 - 8.0   Glucose, UA NEGATIVE NEGATIVE mg/dL   Hgb urine dipstick SMALL (A) NEGATIVE   Bilirubin Urine NEGATIVE NEGATIVE   Ketones, ur NEGATIVE NEGATIVE mg/dL   Protein, ur NEGATIVE NEGATIVE mg/dL   Nitrite NEGATIVE NEGATIVE   Leukocytes, UA SMALL (A) NEGATIVE  CBC with Differential/Platelet     Status: Abnormal   Collection Time: 05/02/17  2:44 AM  Result Value Ref Range   WBC 10.9 (H) 4.0 - 10.5 K/uL   RBC 4.97 3.87 - 5.11 MIL/uL   Hemoglobin 12.9 12.0 - 15.0 g/dL   HCT 30.838.8 65.736.0 - 84.646.0 %   MCV 78.1 78.0 - 100.0 fL   MCH 26.0 26.0 - 34.0 pg   MCHC 33.2 30.0 - 36.0 g/dL   RDW 96.215.0 95.211.5 - 84.115.5 %   Platelets 349 150 - 400 K/uL   Neutrophils Relative % 80 %   Neutro Abs 8.7 (H) 1.7 - 7.7 K/uL   Lymphocytes Relative 15 %   Lymphs Abs 1.6 0.7 - 4.0 K/uL   Monocytes Relative 4 %   Monocytes Absolute 0.4 0.1 - 1.0 K/uL   Eosinophils Relative 1 %   Eosinophils Absolute 0.1 0.0 - 0.7 K/uL   Basophils Relative 0 %   Basophils Absolute 0.0 0.0 - 0.1 K/uL  Urinalysis, Microscopic (reflex)     Status: Abnormal   Collection Time: 05/02/17  2:44 AM  Result Value Ref Range   RBC / HPF 0-5 0 - 5 RBC/hpf   WBC, UA TOO NUMEROUS TO COUNT 0 - 5 WBC/hpf   Bacteria, UA FEW (A) NONE SEEN   Squamous Epithelial / LPF 0-5 (A) NONE SEEN   Mucus PRESENT    Imaging Studies: No results found.  ED COURSE  Nursing notes and initial vitals signs, including pulse oximetry, reviewed.  Vitals:   05/02/17 0238 05/02/17 0252 05/02/17 0434  BP:  (!) 166/94 (!) 156/79  Pulse:  77 73  Resp:  18   Temp:  98.7 F (37.1 C)   TempSrc:  Oral   SpO2:  99% 96%  Weight: 99.8 kg (220 lb)    Height: 5\' 7"  (1.702 m)     3:53 AM The patient denies dysuria but her urinalysis is consistent with an active urinary tract infection.  We will give Rocephin IV to initiate treatment of this.  We have also given Carafate and  IV Protonix for possible gastric etiology.   5:15 AM Patient given Rocephin IV.  Patient still complaining of mid abdominal cramping.  We  will give a dose of analgesics in the ED.  We will treat her for urinary tract infection as well as abdominal cramping.  PROCEDURES    ED DIAGNOSES     ICD-10-CM   1. Lower urinary tract infectious disease N39.0   2. Abdominal cramping R10.9        Avaneesh Pepitone, Jonny Ruiz, MD 05/02/17 [id number]

## 2017-05-02 NOTE — ED Triage Notes (Signed)
Pt reports RLQ and LLQ pain x 1 day. Pain radiates to lower back. Onset after eating chicken yesterday PM. Pt reports nausea, no vomiting or diarrhea. Pt also reports taking 800mg  ibuprofen daily x 1 year for back pain.

## 2017-05-03 LAB — URINE CULTURE

## 2019-05-10 ENCOUNTER — Ambulatory Visit: Payer: Self-pay | Attending: Internal Medicine

## 2019-05-10 DIAGNOSIS — Z23 Encounter for immunization: Secondary | ICD-10-CM | POA: Insufficient documentation

## 2019-05-10 NOTE — Progress Notes (Signed)
   Covid-19 Vaccination Clinic  Name:  FIDELIA CATHERS    MRN: [id number] DOB: 1965-04-14  05/10/2019  Ms. Veazie was observed post Covid-19 immunization for 15 minutes without incident. She was provided with Vaccine Information Sheet and instruction to access the V-Safe system.   Ms. Rasor was instructed to call 911 with any severe reactions post vaccine: Marland Kitchen Difficulty breathing  . Swelling of face and throat  . A fast heartbeat  . A bad rash all over body  . Dizziness and weakness   Immunizations Administered    Name Date Dose VIS Date Route   Pfizer COVID-19 Vaccine 05/10/2019  3:59 PM 0.3 mL 02/14/2019 Intramuscular   Manufacturer: [employer], [employer]   Lot: [id number]   NDC: [address]-8

## 2019-05-31 ENCOUNTER — Ambulatory Visit: Payer: Self-pay | Attending: Internal Medicine

## 2019-05-31 DIAGNOSIS — Z23 Encounter for immunization: Secondary | ICD-10-CM

## 2019-05-31 NOTE — Progress Notes (Signed)
   Covid-19 Vaccination Clinic  Name:  Kelsey Warren    MRN: [id number] DOB: 1966-02-15  05/31/2019  [name] was observed post Covid-19 immunization for 15 minutes without incident. She was provided with Vaccine Information Sheet and instruction to access the V-Safe system.   [name] was instructed to call 911 with any severe reactions post vaccine: Marland Kitchen Difficulty breathing  . Swelling of face and throat  . A fast heartbeat  . A bad rash all over body  . Dizziness and weakness   Immunizations Administered    Name Date Dose VIS Date Route   Pfizer COVID-19 Vaccine 05/31/2019  1:28 PM 0.3 mL 02/14/2019 Intramuscular   Manufacturer: [employer], [employer]   Lot: [id number]   NDC: [address]-3
